# Patient Record
Sex: Male | Born: 1995 | Hispanic: Yes | Marital: Married | Smoking: Never smoker
Health system: Southern US, Community
[De-identification: ages and names within clinical notes are randomized; demographics above are authoritative.]

## PROBLEM LIST (undated history)

## (undated) DIAGNOSIS — R55 Syncope and collapse: Secondary | ICD-10-CM

## (undated) DIAGNOSIS — E162 Hypoglycemia, unspecified: Secondary | ICD-10-CM

## (undated) HISTORY — DX: Hypoglycemia, unspecified: E16.2

## (undated) HISTORY — DX: Syncope and collapse: R55

## (undated) SURGERY — ORCHIOPEXY ADULT
Anesthesia: General | Laterality: Bilateral

---
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
## 2013-04-02 ENCOUNTER — Ambulatory Visit (INDEPENDENT_AMBULATORY_CARE_PROVIDER_SITE_OTHER): Payer: BC Managed Care – PPO | Admitting: Physician Assistant

## 2013-04-02 VITALS — BP 120/80 | HR 76 | Temp 99.3°F | Resp 16 | Ht 71.5 in | Wt 165.0 lb

## 2013-04-02 DIAGNOSIS — J069 Acute upper respiratory infection, unspecified: Secondary | ICD-10-CM

## 2013-04-02 DIAGNOSIS — B9789 Other viral agents as the cause of diseases classified elsewhere: Principal | ICD-10-CM

## 2013-04-02 MED ORDER — GUAIFENESIN ER 1200 MG PO TB12: 1.0000 | ORAL_TABLET | Freq: Two times a day (BID) | ORAL | Status: DC | PRN

## 2013-04-02 MED ORDER — BENZONATATE 100 MG PO CAPS: 100.0000 mg | ORAL_CAPSULE | Freq: Three times a day (TID) | ORAL | Status: DC | PRN

## 2013-04-02 MED ORDER — IPRATROPIUM BROMIDE 0.03 % NA SOLN: 2.0000 | Freq: Two times a day (BID) | NASAL | Status: DC

## 2013-04-02 NOTE — Patient Instructions (Signed)
Get plenty of rest and drink at least 64 ounces of water daily. Use ibuprofen or acetaminophen as needed for body aches and fever. If your symptoms worsen, or do not begin to improve in the next 72 hours, please return for re-evaluation.

## 2013-04-02 NOTE — Progress Notes (Signed)
Subjective:    Patient ID: Robert HudsonKevin Levy, male    DOB: November 07, 1995, 18 y.o.   MRN: [id number]   PCP: No primary provider on file.  Chief Complaint  Patient presents with  . Back Pain    x 1 week  . Cough    x 1 week      Active Ambulatory Problems    Diagnosis Date Noted  . Abnormal EKG 05/30/2012  . HTN (hypertension) 05/30/2012   Resolved Ambulatory Problems    Diagnosis Date Noted  . Testicular torsion 02/24/2011   Past Medical History  Diagnosis Date  . Syncope   . Hypoglycemia     Past Surgical History  Procedure Laterality Date  . Testicle torsion reduction  02/24/2011    Procedure: TESTICULAR TORSION REPAIR;  Surgeon: Anner CreteJohn J Wrenn, MD;  [facility];  Service: Urology;  Laterality: N/A;    No Known Allergies  Prior to Admission medications   Not on File    History   Social History  . Marital Status: Single    Spouse Name: n/a    Number of Children: 0  . Years of Education: 12   Occupational History  . [employer]orter     [employer] (part time)   Social History Main Topics  . Smoking status: Never Smoker   . Smokeless tobacco: Never Used  . Alcohol Use: No  . Drug Use: No  . Sexual Activity: None   Other Topics Concern  . None   Social History Narrative   Graduated from [employer]. Plans to go to college fall 2015.    family history includes Hyperlipidemia in his father. indicated that his mother is alive. He indicated that his father is alive. He indicated that both of his sisters are alive. He indicated that his maternal grandmother is alive. He indicated that his maternal grandfather is alive. He indicated that his paternal grandmother is alive. He indicated that his paternal grandfather is alive.   HPI  "For the past few days, I've been having a cold." Thought it was just a "little cold." Now with body aches and "non-stop coughing." Feels feverish. Some low back pain with movement, like sit-to-stand. No nausea, vomiting.  Some  diarrhea.  No urinary symptoms. No radicular pain.  No lower extremity weakness or paresthesias.  He did get a flu vaccine this season.  Review of Systems No CP, palpitations, SOB, HA.    Objective:   Physical Exam Blood pressure 120/80, pulse 76, temperature 99.3 F (37.4 C), temperature source Oral, resp. rate 16, height 5' 11.5" (1.816 m), weight 165 lb (74.844 kg), SpO2 99.00%. Body mass index is 22.69 kg/(m^2). Well-developed, well nourished male who is awake, alert and oriented, in NAD. HEENT: [place]/AT, PERRL, EOMI.  Sclera and conjunctiva are clear.  EAC are patent, TMs are normal in appearance. Nasal mucosa is congested, pink and moist. OP is clear. Neck: supple, non-tender, no lymphadenopathy, thyromegaly. Heart: RRR, no murmur Lungs: normal effort, CTA Extremities: no cyanosis, clubbing or edema. Skin: warm and dry without rash. Psychologic: good mood and appropriate affect, normal speech and behavior.        Assessment & Plan:  1. Viral URI with cough Supportive care.  NSAIDS for muscle pain. RTC if symptoms worsen/persist. - ipratropium (ATROVENT) 0.03 % nasal spray; Place 2 sprays into both nostrils 2 (two) times daily.  Dispense: 30 mL; Refill: 0 - benzonatate (TESSALON) 100 MG capsule; Take 1-2 capsules (100-200 mg total) by mouth 3 (three)  times daily as needed for cough.  Dispense: 40 capsule; Refill: 0 - Guaifenesin (MUCINEX MAXIMUM STRENGTH) 1200 MG TB12; Take 1 tablet (1,200 mg total) by mouth every 12 (twelve) hours as needed.  Dispense: 14 tablet; Refill: 1   Fernande Bras, PA-C Physician Assistant-Certified [facility]

## 2014-03-04 ENCOUNTER — Other Ambulatory Visit: Payer: Self-pay

## 2014-03-04 ENCOUNTER — Encounter (HOSPITAL_COMMUNITY): Payer: Self-pay | Admitting: Family Medicine

## 2014-03-04 ENCOUNTER — Emergency Department (HOSPITAL_COMMUNITY)
Admission: EM | Admit: 2014-03-04 | Discharge: 2014-03-04 | Disposition: A | Discharge status: Home / Self Care | Payer: No Typology Code available for payment source | Attending: Emergency Medicine | Admitting: Emergency Medicine

## 2014-03-04 DIAGNOSIS — R110 Nausea: Secondary | ICD-10-CM | POA: Diagnosis not present

## 2014-03-04 DIAGNOSIS — Y9389 Activity, other specified: Secondary | ICD-10-CM | POA: Diagnosis not present

## 2014-03-04 DIAGNOSIS — Z8639 Personal history of other endocrine, nutritional and metabolic disease: Secondary | ICD-10-CM | POA: Insufficient documentation

## 2014-03-04 DIAGNOSIS — Y92410 Unspecified street and highway as the place of occurrence of the external cause: Secondary | ICD-10-CM | POA: Insufficient documentation

## 2014-03-04 DIAGNOSIS — Z79899 Other long term (current) drug therapy: Secondary | ICD-10-CM | POA: Insufficient documentation

## 2014-03-04 DIAGNOSIS — R42 Dizziness and giddiness: Secondary | ICD-10-CM | POA: Diagnosis not present

## 2014-03-04 DIAGNOSIS — R55 Syncope and collapse: Secondary | ICD-10-CM | POA: Diagnosis not present

## 2014-03-04 DIAGNOSIS — Y998 Other external cause status: Secondary | ICD-10-CM | POA: Diagnosis not present

## 2014-03-04 DIAGNOSIS — S199XXA Unspecified injury of neck, initial encounter: Secondary | ICD-10-CM | POA: Insufficient documentation

## 2014-03-04 MED ORDER — NAPROXEN 500 MG PO TABS: 500.0000 mg | ORAL_TABLET | Freq: Two times a day (BID) | ORAL | Status: DC
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
## 2020-02-29 ENCOUNTER — Emergency Department: Payer: No Typology Code available for payment source

## 2020-02-29 ENCOUNTER — Encounter: Payer: Self-pay | Admitting: Emergency Medicine

## 2020-02-29 ENCOUNTER — Other Ambulatory Visit: Payer: Self-pay

## 2020-02-29 ENCOUNTER — Emergency Department
Admission: EM | Admit: 2020-02-29 | Discharge: 2020-02-29 | Disposition: A | Discharge status: Home / Self Care | Payer: No Typology Code available for payment source | Attending: Emergency Medicine | Admitting: Emergency Medicine

## 2020-02-29 DIAGNOSIS — W1789XA Other fall from one level to another, initial encounter: Secondary | ICD-10-CM | POA: Insufficient documentation

## 2020-02-29 DIAGNOSIS — S20212A Contusion of left front wall of thorax, initial encounter: Secondary | ICD-10-CM

## 2020-02-29 DIAGNOSIS — W19XXXA Unspecified fall, initial encounter: Secondary | ICD-10-CM

## 2020-02-29 DIAGNOSIS — Y990 Civilian activity done for income or pay: Secondary | ICD-10-CM | POA: Diagnosis not present

## 2020-02-29 DIAGNOSIS — I10 Essential (primary) hypertension: Secondary | ICD-10-CM | POA: Insufficient documentation

## 2020-02-29 DIAGNOSIS — S299XXA Unspecified injury of thorax, initial encounter: Secondary | ICD-10-CM | POA: Diagnosis present

## 2020-02-29 MED ORDER — HYDROCODONE-ACETAMINOPHEN 5-325 MG PO TABS
1.0000 | ORAL_TABLET | Freq: Once | ORAL | Status: AC
Start: 2020-02-29 — End: 2020-02-29
  Administered 2020-02-29: 1 via ORAL
  Filled 2020-02-29: qty 1

## 2020-02-29 MED ORDER — MELOXICAM 15 MG PO TABS: 15.0000 mg | ORAL_TABLET | Freq: Every day | ORAL | 2 refills | Status: DC

## 2020-02-29 MED ORDER — BACLOFEN 10 MG PO TABS: 10.0000 mg | ORAL_TABLET | Freq: Three times a day (TID) | ORAL | 1 refills | Status: AC

## 2020-02-29 MED ORDER — HYDROCODONE-ACETAMINOPHEN 5-325 MG PO TABS: 1.0000 | ORAL_TABLET | Freq: Four times a day (QID) | ORAL | 0 refills | Status: DC | PRN

## 2020-02-29 NOTE — ED Triage Notes (Signed)
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  Pt states he fell on his left side.  PT c/o left rib pain and upper back discomfort. PT ambulatory to triage at this time and in NAD>

## 2020-02-29 NOTE — ED Notes (Signed)
Workers comp completed by Industrial/product designer. Pt's boss Jill Alexanders called to verify needed specimens, urine obtained and papers filled, placed in lab bin at 1244pm.

## 2020-02-29 NOTE — Discharge Instructions (Signed)
Return emergency department worsening.  Follow-up with a provider of Worker's Comp. choice if not improving in 3 to 5 days for repeat x-ray.  Take the medication as prescribed.  Do not drive vehicles while taking the narcotic

## 2020-02-29 NOTE — ED Provider Notes (Signed)
[facility] Emergency Department Provider Note  ____________________________________________   Event Date/Time   First MD Initiated Contact with Patient 02/29/20 1223     (approximate)  I have reviewed the triage vital signs and the nursing notes.   HISTORY  Chief Complaint Fall    HPI Robert Levy is a 25 y.o. 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  States he stepped onto the trailer and fell falling onto his left side.  States he has severe left rib pain.  He denies hitting his head or having abdominal pain.  No vomiting.  No LOC    Past Medical History:  Diagnosis Date  . Hypoglycemia   . Syncope     Patient Active Problem List   Diagnosis Date Noted  . Abnormal EKG 05/30/2012  . HTN (hypertension) 05/30/2012    Past Surgical History:  Procedure Laterality Date  . TESTICLE TORSION REDUCTION  02/24/2011   Procedure: TESTICULAR TORSION REPAIR;  Surgeon: Anner Crete, MD;  Location: [facility] OR;  Service: Urology;  Laterality: N/A;    Prior to Admission medications   Medication Sig Start Date End Date Taking? Authorizing Provider  baclofen (LIORESAL) 10 MG tablet Take 1 tablet (10 mg total) by mouth 3 (three) times daily for 10 days. 02/29/20 03/10/20 Yes Subrina Vecchiarelli, Roselyn Bering, PA-C  HYDROcodone-acetaminophen (NORCO/VICODIN) 5-325 MG tablet Take 1 tablet by mouth every 6 (six) hours as needed for moderate pain. 02/29/20  Yes Devynne Sturdivant, Roselyn Bering, PA-C  meloxicam (MOBIC) 15 MG tablet Take 1 tablet (15 mg total) by mouth daily. 02/29/20 02/28/21 Yes Tajah Noguchi, Roselyn Bering, PA-C  ipratropium (ATROVENT) 0.03 % nasal spray Place 2 sprays into both nostrils 2 (two) times daily. 04/02/13   Porfirio Oar, PA    Allergies Patient has no known allergies.  Family History  Problem Relation Age of Onset  . Hyperlipidemia Father     Social History Social History   Tobacco Use  . Smoking status: Never Smoker   . Smokeless tobacco: Never Used  Substance Use Topics  . Alcohol use: No  . Drug use: No    Review of Systems  Constitutional: No fever/chills Eyes: No visual changes. ENT: No sore throat. Respiratory: Denies cough, positive left rib pain Cardiovascular: Denies chest pain Gastrointestinal: Denies abdominal pain Genitourinary: Negative for dysuria. Musculoskeletal: Negative for back pain. Skin: Negative for rash. Psychiatric: no mood changes,     ____________________________________________   PHYSICAL EXAM:  VITAL SIGNS: ED Triage Vitals  Enc Vitals Group     BP 02/29/20 1132 137/85     Pulse Rate 02/29/20 1132 69     Resp 02/29/20 1132 18     Temp 02/29/20 1132 98.2 F (36.8 C)     Temp Source 02/29/20 1132 Oral     SpO2 02/29/20 1132 99 %     Weight 02/29/20 1131 185 lb (83.9 kg)     Height 02/29/20 1131 6' (1.829 m)     Head Circumference --      Peak Flow --      Pain Score 02/29/20 1130 6     Pain Loc --      Pain Edu? --      Excl. in GC? --     Constitutional: Alert and oriented. Well appearing and in no acute distress. Eyes: Conjunctivae are normal.  Head: Atraumatic. Nose: No congestion/rhinnorhea. Mouth/Throat: Mucous membranes are moist.  Neck:  supple  no lymphadenopathy noted Cardiovascular: Normal rate, regular rhythm. Heart sounds are normal Respiratory: Normal respiratory effort.  No retractions, lungs c t a, left ribs are very tender to palpation, no bruising is noted at the rib cage Abd: soft nontender bs normal all 4 quad GU: deferred Musculoskeletal: FROM all extremities, warm and well perfused Neurologic:  Normal speech and language.  Skin:  Skin is warm, dry and intact. No rash noted. Psychiatric: Mood and affect are normal. Speech and behavior are normal.  ____________________________________________   LABS (all labs ordered are listed, but only abnormal results are displayed)  Labs Reviewed - No data to  display ____________________________________________   ____________________________________________  RADIOLOGY  X-ray of the left ribs with chest are normal  ____________________________________________   PROCEDURES  Procedure(s) performed: No  Procedures    ____________________________________________   INITIAL IMPRESSION / ASSESSMENT AND PLAN / ED COURSE  Pertinent labs & imaging results that were available during my care of the patient were reviewed by me and considered in my medical decision making (see chart for details).   The patient is 25 year old male presents after a fall.  See HPI.  Physical exam shows left ribs to be tender.  Remainder exams unremarkable  X-ray of the left ribs and chest are normal.  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  Explained him would be okay to take the anti-inflammatory.  Worker's Comp. restrictions stating this were sent with him.  These restrictions are in place for 1 week.  If he is worsening he is to return the emergency department.  He was discharged in stable condition.  The tech covid completed the UDS for Worker's Comp.     Robert Levy was evaluated in Emergency Department on 02/29/2020 for the symptoms described in the history of present illness. He was evaluated in the context of the global COVID-19 pandemic, which necessitated consideration that the patient might be at risk for infection with the SARS-CoV-2 virus that causes COVID-19. Institutional protocols and algorithms that pertain to the evaluation of patients at risk for COVID-19 are in a state of rapid change based on information released by regulatory bodies including the CDC and federal and state organizations. These policies and algorithms were followed during the patient's care in the ED.    As part of my medical  decision making, I reviewed the following data within the electronic MEDICAL RECORD NUMBER Nursing notes reviewed and incorporated, Old chart reviewed, Radiograph reviewed , Notes from prior ED visits and [place] Controlled Substance Database  ____________________________________________   FINAL CLINICAL IMPRESSION(S) / ED DIAGNOSES  Final diagnoses:  Fall, initial encounter  Rib contusion, left, initial encounter      NEW MEDICATIONS STARTED DURING THIS VISIT:  Discharge Medication List as of 02/29/2020 12:35 PM    START taking these medications   Details  baclofen (LIORESAL) 10 MG tablet Take 1 tablet (10 mg total) by mouth 3 (three) times daily for 10 days., Starting Thu 02/29/2020, Until Sun 03/10/2020, Print    HYDROcodone-acetaminophen (NORCO/VICODIN) 5-325 MG tablet Take 1 tablet by mouth every 6 (six) hours as needed for moderate pain., Starting Thu 02/29/2020, Print    meloxicam (MOBIC) 15 MG tablet Take 1 tablet (15 mg total) by mouth daily., Starting Thu 02/29/2020, Until Fri 02/28/2021, Print         Note:  This document was prepared  using Conservation officer, historic buildings and may include unintentional dictation errors.    Faythe Ghee, PA-C 02/29/20 1423    Merwyn Katos, MD 02/29/20 [phone]

## 2020-10-03 ENCOUNTER — Emergency Department (HOSPITAL_COMMUNITY): Payer: No Typology Code available for payment source

## 2020-10-03 ENCOUNTER — Emergency Department (HOSPITAL_COMMUNITY)
Admission: EM | Admit: 2020-10-03 | Discharge: 2020-10-03 | Disposition: A | Discharge status: Home / Self Care | Payer: No Typology Code available for payment source | Attending: Emergency Medicine | Admitting: Emergency Medicine

## 2020-10-03 ENCOUNTER — Encounter (HOSPITAL_COMMUNITY): Payer: Self-pay | Admitting: *Deleted

## 2020-10-03 ENCOUNTER — Other Ambulatory Visit: Payer: Self-pay

## 2020-10-03 DIAGNOSIS — I10 Essential (primary) hypertension: Secondary | ICD-10-CM | POA: Diagnosis not present

## 2020-10-03 DIAGNOSIS — Y900 Blood alcohol level of less than 20 mg/100 ml: Secondary | ICD-10-CM | POA: Diagnosis not present

## 2020-10-03 DIAGNOSIS — S39012A Strain of muscle, fascia and tendon of lower back, initial encounter: Secondary | ICD-10-CM | POA: Insufficient documentation

## 2020-10-03 DIAGNOSIS — X500XXA Overexertion from strenuous movement or load, initial encounter: Secondary | ICD-10-CM | POA: Diagnosis not present

## 2020-10-03 DIAGNOSIS — S3992XA Unspecified injury of lower back, initial encounter: Secondary | ICD-10-CM | POA: Diagnosis present

## 2020-10-03 MED ORDER — NAPROXEN 500 MG PO TABS: 500.0000 mg | ORAL_TABLET | Freq: Two times a day (BID) | ORAL | 0 refills | Status: AC

## 2020-10-03 MED ORDER — OXYCODONE-ACETAMINOPHEN 5-325 MG PO TABS
1.0000 | ORAL_TABLET | Freq: Once | ORAL | Status: AC
  Administered 2020-10-03: 1 via ORAL
  Filled 2020-10-03: qty 1

## 2020-10-03 MED ORDER — KETOROLAC TROMETHAMINE 30 MG/ML IJ SOLN
30.0000 mg | Freq: Once | INTRAMUSCULAR | Status: AC
  Administered 2020-10-03: 30 mg via INTRAMUSCULAR
  Filled 2020-10-03: qty 1

## 2020-10-03 MED ORDER — METHOCARBAMOL 500 MG PO TABS
1000.0000 mg | ORAL_TABLET | Freq: Once | ORAL | Status: AC
Start: 2020-10-03 — End: 2020-10-03
  Administered 2020-10-03: 1000 mg via ORAL
  Filled 2020-10-03: qty 2

## 2020-10-03 MED ORDER — METHOCARBAMOL 500 MG PO TABS: 1000.0000 mg | ORAL_TABLET | Freq: Four times a day (QID) | ORAL | 0 refills | Status: DC | PRN

## 2020-10-03 NOTE — ED Notes (Signed)
An After Visit Summary was printed and given to the patient. Discharge instructions given and no further questions at this time.  Pt leaving with fiance.

## 2020-10-03 NOTE — Discharge Instructions (Addendum)
Rest in comfortable position with heating pad. Take Robaxin and Naproxen as needed for pain control. Follow-up with neurosurgery for further management if needed.

## 2020-10-03 NOTE — ED Provider Notes (Signed)
[facility]-EMERGENCY DEPT Provider Note   CSN: [id number] Arrival date & time: 10/03/20  1208     History Chief Complaint  Patient presents with   Back Pain    Robert Levy is a 25 y.o. male.  Patient with no significant past medical history presents today with chief complaint of low back injury. Patient states that he was lifting a heavy door at his job this morning and felt a stretching pain in his central lumbar spine followed by significant pain throughout his lower back and sacral region. Patient in obvious discomfort. Denies fever, chills, bowel or bladder incontinence, sacral numbness, or changes in motor function or sensation throughout lower extremities.   The history is provided by the patient. No language interpreter was used.  Back Pain Location:  Lumbar spine Quality:  Stabbing and cramping Radiates to:  R posterior upper leg Onset quality:  Sudden Associated symptoms: no abdominal pain, no abdominal swelling, no bladder incontinence, no bowel incontinence, no chest pain, no dysuria, no fever, no numbness, no paresthesias and no tingling       Past Medical History:  Diagnosis Date   Hypoglycemia    Syncope     Patient Active Problem List   Diagnosis Date Noted   Abnormal EKG 05/30/2012   HTN (hypertension) 05/30/2012    Past Surgical History:  Procedure Laterality Date   TESTICLE TORSION REDUCTION  02/24/2011   Procedure: TESTICULAR TORSION REPAIR;  Surgeon: Anner Crete, MD;  Location: [facility] OR;  Service: Urology;  Laterality: N/A;       Family History  Problem Relation Age of Onset   Hyperlipidemia Father     Social History   Tobacco Use   Smoking status: Never   Smokeless tobacco: Never  Substance Use Topics   Alcohol use: No   Drug use: No    Home Medications Prior to Admission medications   Medication Sig Start Date End Date Taking? Authorizing Provider  HYDROcodone-acetaminophen (NORCO/VICODIN) 5-325 MG tablet  Take 1 tablet by mouth every 6 (six) hours as needed for moderate pain. 02/29/20   Sherrie Mustache Roselyn Bering, PA-C  ipratropium (ATROVENT) 0.03 % nasal spray Place 2 sprays into both nostrils 2 (two) times daily. 04/02/13   Porfirio Oar, PA  meloxicam (MOBIC) 15 MG tablet Take 1 tablet (15 mg total) by mouth daily. 02/29/20 02/28/21  Faythe Ghee, PA-C    Allergies    Patient has no known allergies.  Review of Systems   Review of Systems  Constitutional:  Negative for chills and fever.  HENT:  Negative for ear pain and sore throat.   Eyes:  Negative for pain and visual disturbance.  Respiratory:  Negative for cough and shortness of breath.   Cardiovascular:  Negative for chest pain and palpitations.  Gastrointestinal:  Negative for abdominal pain, bowel incontinence and vomiting.  Genitourinary:  Negative for bladder incontinence, dysuria and hematuria.  Musculoskeletal:  Positive for back pain. Negative for arthralgias.  Skin:  Negative for color change and rash.  Neurological:  Negative for tingling, seizures, syncope, numbness and paresthesias.  All other systems reviewed and are negative.  Physical Exam Updated Vital Signs BP 125/81 (BP Location: Right Arm)   Pulse 80   Temp 98.3 F (36.8 C) (Oral)   Resp 18   SpO2 100%   Physical Exam Vitals and nursing note reviewed.  Constitutional:      General: He is not in acute distress.    Appearance: Normal appearance. He is  not ill-appearing, toxic-appearing or diaphoretic.  HENT:     Head: Normocephalic.  Cardiovascular:     Rate and Rhythm: Normal rate.  Pulmonary:     Effort: Pulmonary effort is normal.  Abdominal:     General: Abdomen is flat.  Musculoskeletal:     Cervical back: Normal and normal range of motion.     Thoracic back: Normal.     Lumbar back: Spasms and tenderness present. No swelling, edema, deformity, signs of trauma, lacerations or bony tenderness. Normal range of motion. Negative right straight leg raise  test and negative left straight leg raise test. No scoliosis.  Skin:    General: Skin is warm and dry.  Neurological:     General: No focal deficit present.     Mental Status: He is alert and oriented to person, place, and time.     Sensory: No sensory deficit.     Coordination: Coordination normal.  Psychiatric:        Behavior: Behavior normal.    ED Results / Procedures / Treatments   Labs (all labs ordered are listed, but only abnormal results are displayed) Labs Reviewed - No data to display  EKG None  Radiology DG Lumbar Spine Complete  Result Date: 10/03/2020 CLINICAL DATA:  Back injury EXAM: LUMBAR SPINE - COMPLETE 4+ VIEW COMPARISON:  None. FINDINGS: There is no evidence of lumbar spine fracture. Alignment is normal. Intervertebral disc spaces are maintained. IMPRESSION: Negative. Electronically Signed   By: Guadlupe Spanish M.D.   On: 10/03/2020 13:46    Procedures Procedures   Medications Ordered in ED Medications  ketorolac (TORADOL) 30 MG/ML injection 30 mg (has no administration in time range)  methocarbamol (ROBAXIN) tablet 1,000 mg (has no administration in time range)    ED Course  I have reviewed the triage vital signs and the nursing notes.  Pertinent labs & imaging results that were available during my care of the patient were reviewed by me and considered in my medical decision making (see chart for details).    MDM Rules/Calculators/A&P                         Patient presents today for evaluation of acute lower back injury. Patient is afebrile and non-toxic appearing. No step-offs or deformities noted on exam. Not suspicious for cauda equina, epidural abscess or other infectious process. No changes in sensation or motor function noted throughout lower extremities. DG Lumbar spine ordered at patients request, no deformity noted. Toradol, Percocet, and Robaxin given for immediate pain control. Suspect lumbar muscle strain, recommend rest, heat, and pain  control with Robaxin and Naproxen. Neurosurgery follow-up if needed.   Final Clinical Impression(s) / ED Diagnoses Final diagnoses:  Strain of lumbar region, initial encounter    Rx / DC Orders ED Discharge Orders          Ordered    methocarbamol (ROBAXIN) 500 MG tablet  Every 6 hours PRN        10/03/20 1415    naproxen (NAPROSYN) 500 MG tablet  2 times daily        10/03/20 1415          An After Visit Summary was printed and given to the patient.    Silva Bandy, [place] 10/03/20 1424    Jacalyn Lefevre, MD 10/03/20 1510

## 2020-10-03 NOTE — ED Triage Notes (Signed)
Pt complains of lower back pain since lifting heavy object at work today.

## 2023-03-20 IMAGING — CR DG LUMBAR SPINE COMPLETE 4+V
5 series · 5 of 5 positions shown · non-contrast
Comparison: None.

CLINICAL DATA: Back injury

EXAM:
LUMBAR SPINE - COMPLETE 4+ VIEW

[t lumbar spine ap]
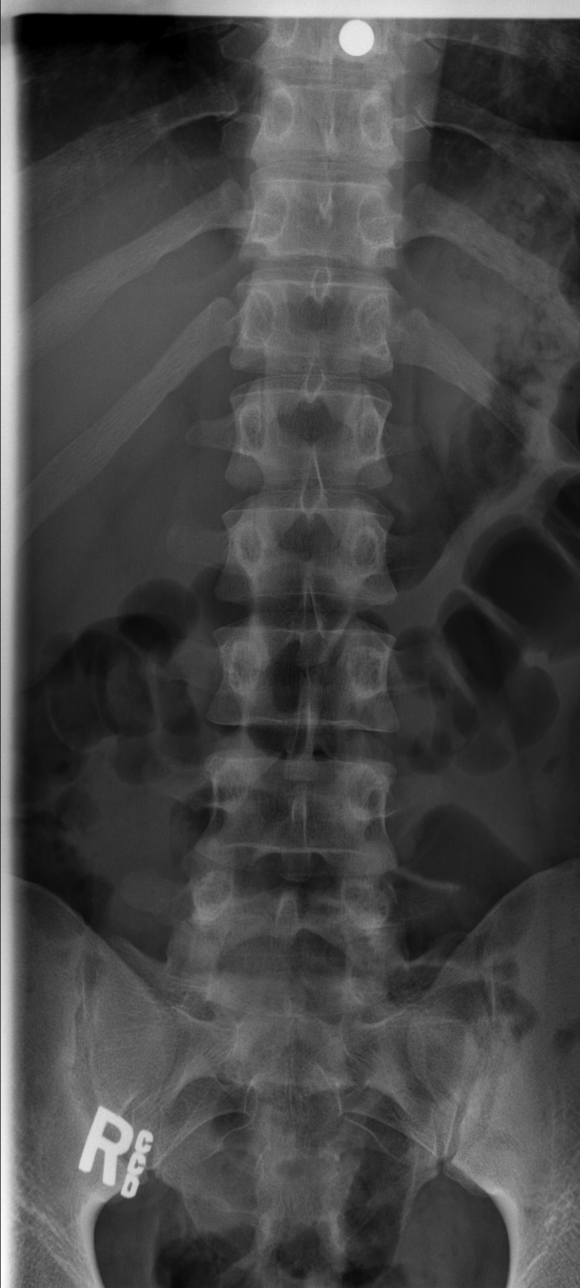

[t lumbar spine obl (1 of 2)]
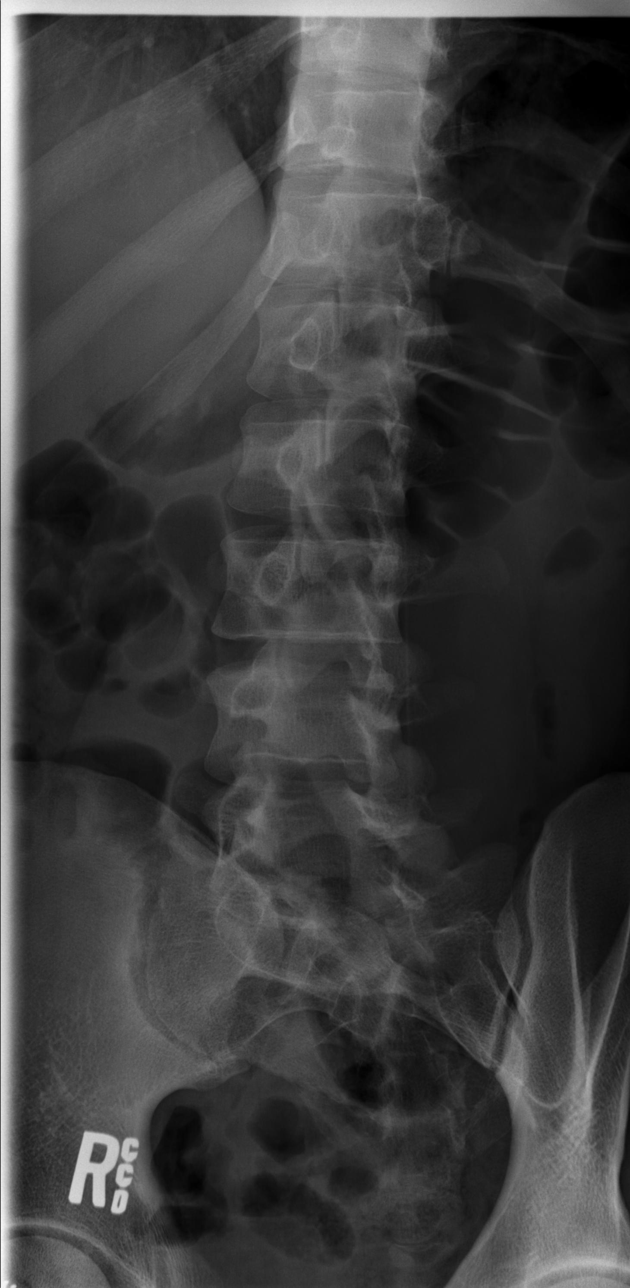

[t lumbar spine obl (2 of 2)]
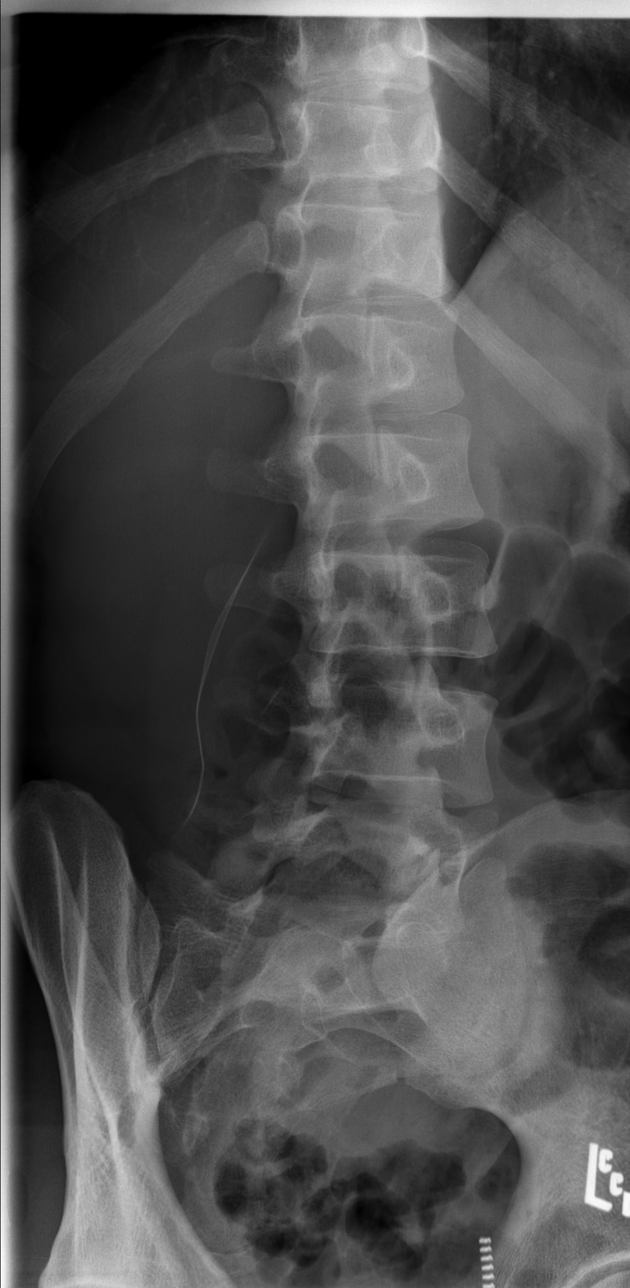

[t lumbar spine lat]
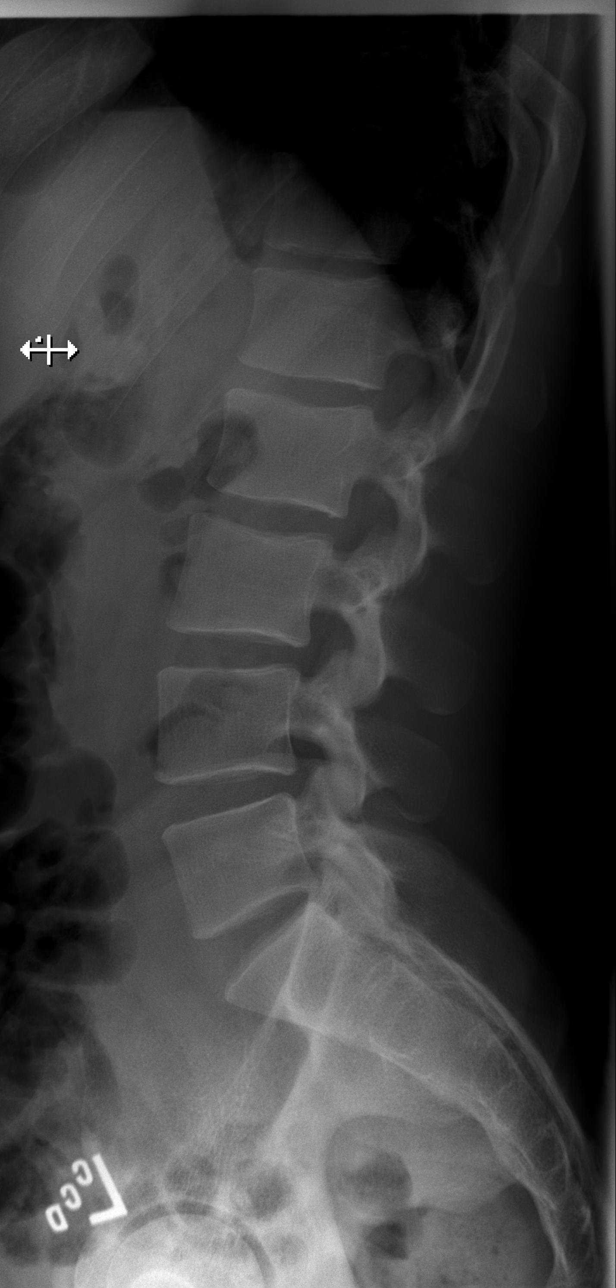

[t lumbar l-5 s-1 spot]
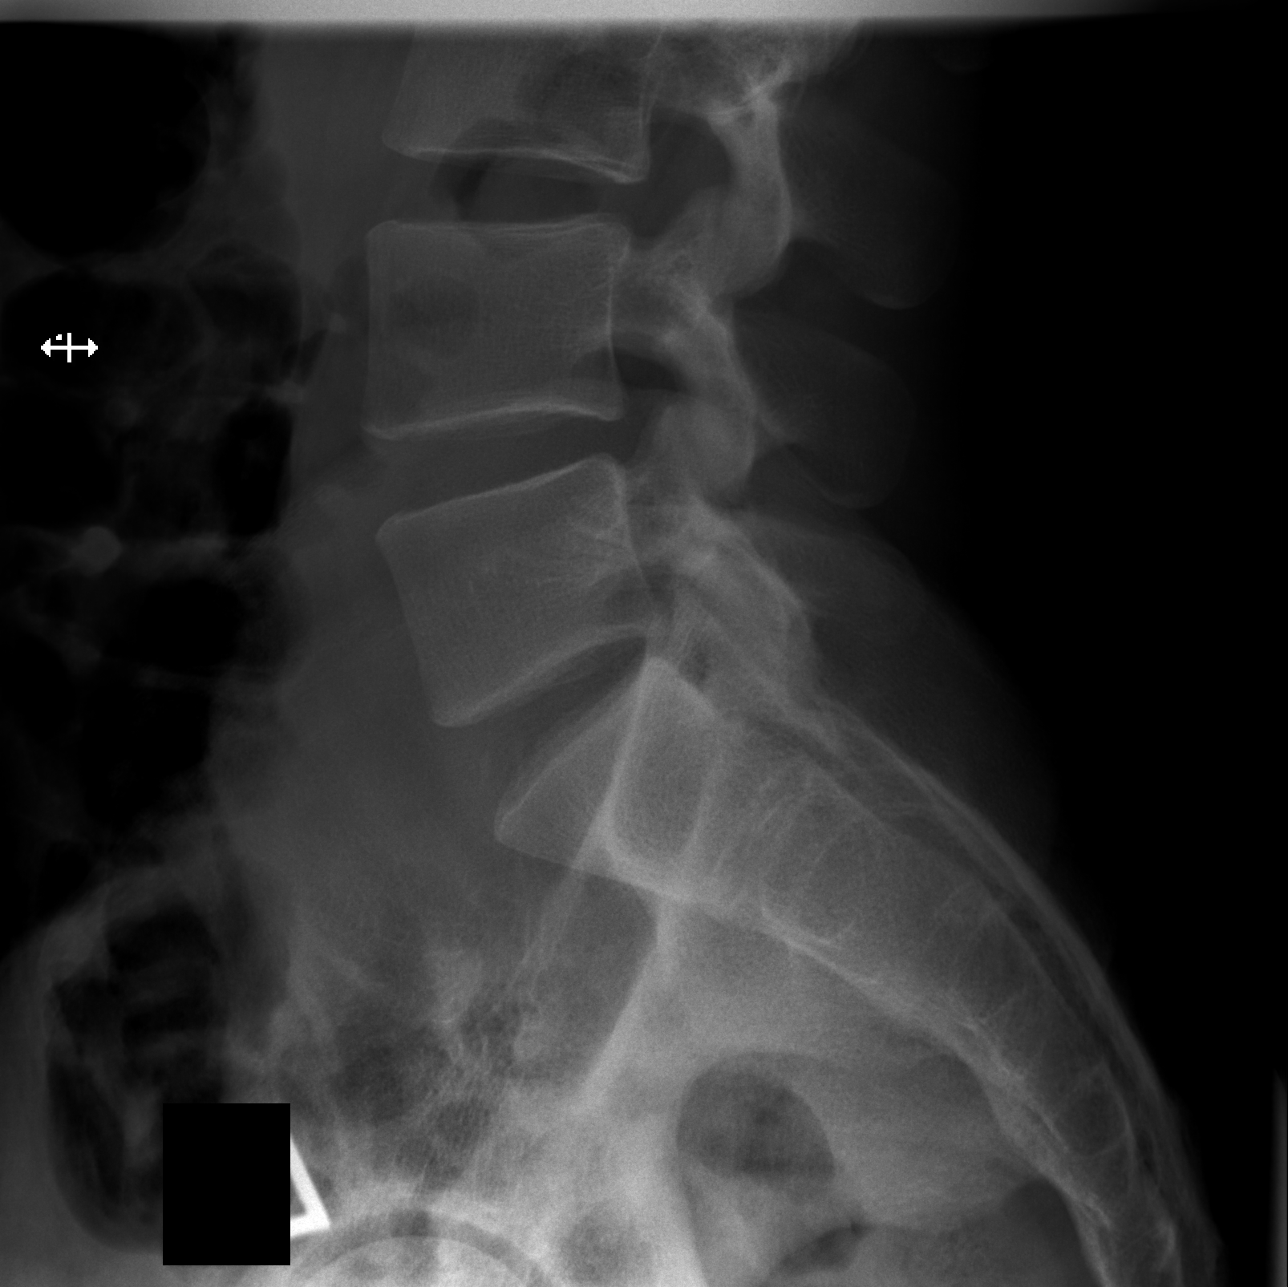

[5 of 5 positions shown; findings below may reference images not displayed]

FINDINGS: There is no evidence of lumbar spine fracture. Alignment is normal.
Intervertebral disc spaces are maintained.
IMPRESSION: Negative.

## 2023-12-31 ENCOUNTER — Emergency Department (HOSPITAL_COMMUNITY)
Admission: EM | Admit: 2023-12-31 | Discharge: 2023-12-31 | Disposition: A | Discharge status: Home / Self Care | Payer: Worker's Compensation | Attending: Emergency Medicine | Admitting: Emergency Medicine

## 2023-12-31 ENCOUNTER — Emergency Department (HOSPITAL_COMMUNITY): Payer: Worker's Compensation

## 2023-12-31 DIAGNOSIS — Z23 Encounter for immunization: Secondary | ICD-10-CM | POA: Diagnosis not present

## 2023-12-31 DIAGNOSIS — Y990 Civilian activity done for income or pay: Secondary | ICD-10-CM | POA: Diagnosis not present

## 2023-12-31 DIAGNOSIS — S8991XA Unspecified injury of right lower leg, initial encounter: Secondary | ICD-10-CM | POA: Diagnosis present

## 2023-12-31 DIAGNOSIS — S81011A Laceration without foreign body, right knee, initial encounter: Secondary | ICD-10-CM | POA: Diagnosis not present

## 2023-12-31 DIAGNOSIS — W1830XA Fall on same level, unspecified, initial encounter: Secondary | ICD-10-CM | POA: Diagnosis not present

## 2023-12-31 MED ORDER — HYDROMORPHONE HCL 1 MG/ML IJ SOLN
0.5000 mg | Freq: Once | INTRAMUSCULAR | Status: AC
  Administered 2023-12-31: 0.5 mg via INTRAVENOUS
  Filled 2023-12-31: qty 1

## 2023-12-31 MED ORDER — HYDROCODONE-ACETAMINOPHEN 5-325 MG PO TABS
1.0000 | ORAL_TABLET | Freq: Four times a day (QID) | ORAL | 0 refills | Status: AC | PRN
Start: 2023-12-31 — End: ?

## 2023-12-31 MED ORDER — LIDOCAINE-EPINEPHRINE-TETRACAINE (LET) TOPICAL GEL
3.0000 mL | Freq: Once | TOPICAL | Status: AC
  Administered 2023-12-31: 3 mL via TOPICAL
  Filled 2023-12-31: qty 3

## 2023-12-31 MED ORDER — TETANUS-DIPHTH-ACELL PERTUSSIS 5-2-15.5 LF-MCG/0.5 IM SUSP
0.5000 mL | Freq: Once | INTRAMUSCULAR | Status: AC
  Administered 2023-12-31: 0.5 mL via INTRAMUSCULAR
  Filled 2023-12-31: qty 0.5

## 2023-12-31 MED ORDER — SODIUM CHLORIDE 0.9 % IV SOLN
1.0000 g | Freq: Once | INTRAVENOUS | Status: AC
  Administered 2023-12-31: 1 g via INTRAVENOUS
  Filled 2023-12-31: qty 10

## 2023-12-31 MED ORDER — SODIUM CHLORIDE 0.9 % IV BOLUS
1000.0000 mL | Freq: Once | INTRAVENOUS | Status: AC
  Administered 2023-12-31: 1000 mL via INTRAVENOUS

## 2023-12-31 MED ORDER — LIDOCAINE HCL (PF) 1 % IJ SOLN
30.0000 mL | Freq: Once | INTRAMUSCULAR | Status: AC
  Administered 2023-12-31: 30 mL
  Filled 2023-12-31: qty 30

## 2023-12-31 MED ORDER — CEPHALEXIN 500 MG PO CAPS: 500.0000 mg | ORAL_CAPSULE | Freq: Two times a day (BID) | ORAL | 0 refills | Status: AC

## 2023-12-31 NOTE — Progress Notes (Signed)
 Orthopedic Tech Progress Note Patient Details:  Robert Levy [birth year] [id number]  Ortho Devices Type of Ortho Device: Crutches, Knee Immobilizer Ortho Device/Splint Location: RLE Ortho Device/Splint Interventions: Application   Post Interventions Patient Tolerated: Well  Massie BRAVO Khyri Hinzman 12/31/2023, 3:47 PM

## 2023-12-31 NOTE — ED Provider Notes (Signed)
 [place] EMERGENCY DEPARTMENT AT [facility] Provider Note   CSN: [id number] Arrival date & time: 12/31/23  1051     Patient presents with: Knee Pain   Robert Levy is a 28 y.o. male.   HPI Patient presents with his spouse after a fall that occurred at work. Patient was at work, walking down an incline metal walkway with studs for traction when he fell, striking his knee against the surface.  Patient sustained injury to the inferior right knee, no other trauma, no other complaints.    Prior to Admission medications   Medication Sig Start Date End Date Taking? Authorizing Provider  cephALEXin  (KEFLEX ) 500 MG capsule Take 1 capsule (500 mg total) by mouth 2 (two) times daily for 5 days. 12/31/23 01/05/24 Yes Garrick Charleston, MD  HYDROcodone -acetaminophen  (NORCO/VICODIN) 5-325 MG tablet Take 1 tablet by mouth every 6 (six) hours as needed for severe pain (pain score 7-10). 12/31/23  Yes Garrick Charleston, MD  methocarbamol  (ROBAXIN ) 500 MG tablet Take 2 tablets (1,000 mg total) by mouth every 6 (six) hours as needed for muscle spasms. 10/03/20   Smoot, Lauraine LABOR, PA-C    Allergies: Patient has no known allergies.    Review of Systems  Updated Vital Signs BP (!) 135/99 (BP Location: Left Arm)   Pulse 87   Temp 98 F (36.7 C) (Oral)   Resp (!) 24   SpO2 97%   Physical Exam Vitals and nursing note reviewed.  Constitutional:      General: He is not in acute distress.    Appearance: He is well-developed.  HENT:     Head: Normocephalic and atraumatic.  Eyes:     Conjunctiva/sclera: Conjunctivae normal.  Cardiovascular:     Rate and Rhythm: Normal rate and regular rhythm.     Pulses: Normal pulses.  Pulmonary:     Effort: Pulmonary effort is normal. No respiratory distress.     Breath sounds: No stridor.  Abdominal:     General: There is no distension.  Musculoskeletal:       Legs:  Skin:    General: Skin is warm and dry.  Neurological:     Mental  Status: He is alert and oriented to person, place, and time.     (all labs ordered are listed, but only abnormal results are displayed) Labs Reviewed - No data to display  EKG: None  Radiology: DG Knee Complete 4 Views Right Result Date: 12/31/2023 EXAM: 4 OR MORE VIEW(S) XRAY OF THE KNEE 12/31/2023 11:16:00 AM COMPARISON: None available. CLINICAL HISTORY: knee injury FINDINGS: BONES AND JOINTS: No acute fracture. No focal osseous lesion. No joint dislocation. No significant joint effusion. No significant degenerative changes. SOFT TISSUES: Soft tissue laceration anterior to the tibial tuberosity. Anterior soft tissue swelling. IMPRESSION: 1. Soft tissue laceration anterior to the tibial tuberosity with anterior soft tissue swelling. 2. No acute osseous abnormality. Electronically signed by: Waddell Calk MD 12/31/2023 12:12 PM EST RP Workstation: [id number]     .Laceration Repair  Date/Time: 12/31/2023 2:57 PM  Performed by: Garrick Charleston, MD Authorized by: Garrick Charleston, MD   Consent:    Consent obtained:  Verbal   Consent given by:  Patient   Risks, benefits, and alternatives were discussed: yes     Risks discussed:  Infection, pain, tendon damage, vascular damage, poor wound healing, poor cosmetic result, need for additional repair and nerve damage   Alternatives discussed:  Delayed treatment and referral Universal protocol:    Procedure explained and  questions answered to patient or proxy's satisfaction: yes     Relevant documents present and verified: yes     Test results available: yes     Imaging studies available: yes     Required blood products, implants, devices, and special equipment available: yes     Site/side marked: yes     Immediately prior to procedure, a time out was called: yes     Patient identity confirmed:  Verbally with patient Anesthesia:    Anesthesia method:  Local infiltration   Local anesthetic:  Lidocaine  1% w/o epi Laceration details:     Location:  Leg   Leg location:  R knee   Length (cm):  15   Depth (mm):  15 Pre-procedure details:    Preparation:  Patient was prepped and draped in usual sterile fashion and imaging obtained to evaluate for foreign bodies Exploration:    Limited defect created (wound extended): no     Hemostasis achieved with:  Direct pressure   Imaging obtained: x-ray     Imaging outcome: foreign body not noted     Wound exploration: entire depth of wound visualized     Wound exploration comment:  Patient not tolerant of range of motion   Wound extent: muscle damage, tendon damage (Possible) and vascular damage     Tendon damage location:  Lower extremity   Lower extremity tendon damage location:  Patella tendon   Tendon repair plan:  Refer for evaluation   Contaminated: yes   Treatment:    Area cleansed with:  Povidone-iodine   Amount of cleaning:  Extensive   Irrigation solution:  Sterile saline   Visualized foreign bodies/material removed: no     Debridement:  Moderate   Undermining:  None   Scar revision: no   Skin repair:    Repair method:  Sutures   Suture size:  4-0   Suture material:  Prolene   Suture technique:  Simple interrupted   Number of sutures:  7 Approximation:    Approximation:  Close Repair type:    Repair type:  Complex (Wound required debridement of portions of skin, closure with alignment as portions were also missing.) Post-procedure details:    Dressing:  Non-adherent dressing   Procedure completion:  Tolerated    Medications Ordered in the ED  sodium chloride  0.9 % bolus 1,000 mL (0 mLs Intravenous Stopped 12/31/23 1304)  HYDROmorphone  (DILAUDID ) injection 0.5 mg (0.5 mg Intravenous Given 12/31/23 1155)  cefTRIAXone  (ROCEPHIN ) 1 g in sodium chloride  0.9 % 100 mL IVPB (0 g Intravenous Stopped 12/31/23 1231)  Tdap (ADACEL ) injection 0.5 mL (0.5 mLs Intramuscular Given 12/31/23 1157)  lidocaine -EPINEPHrine -tetracaine  (LET) topical gel (3 mLs Topical Given  12/31/23 1156)  lidocaine  (PF) (XYLOCAINE ) 1 % injection 30 mL (30 mLs Other Given 12/31/23 1402)                                    Medical Decision Making Patient presents after mechanical fall has obvious injury to the right lower knee.  Patient with minimally, but appreciable extension, flexion capacity, may be secondary to pain, though concern for patella tendon contusion versus partial disruption is considered. After my initial assessment, cleaning the wound, I discussed patient's case with our colleague, Dr. Elsa from orthopedics.  Patient with closure here, after x-rays were reviewed, no fracture.  Tetanus, antibiotics, fluids, multiple analgesics all required.  Amount and/or Complexity of Data Reviewed  Independent Historian: spouse Radiology: ordered and independent interpretation performed. Decision-making details documented in ED Course.  Risk Prescription drug management. Decision regarding hospitalization.    3:04 PM Patient tolerated suture repair without complication, though he required antiemetics, multiple doses of local analgesia.  We discussed return precautions, follow-up instructions with Dr. Elsa in three days.  No current evidence for bacteremia, sepsis, patient can move the leg minimally, with extension, though with any pain or tendon disruption, patient will require close follow-up, immobilization, crutches.      Final diagnoses:  Laceration of right knee, initial encounter    ED Discharge Orders          Ordered    cephALEXin  (KEFLEX ) 500 MG capsule  2 times daily        12/31/23 1503    HYDROcodone -acetaminophen  (NORCO/VICODIN) 5-325 MG tablet  Every 6 hours PRN        12/31/23 1503               Garrick Charleston, MD 12/31/23 1504

## 2023-12-31 NOTE — Discharge Instructions (Addendum)
 Please be sure to call our orthopedic colleagues for follow-up on Monday.  Return here for concerning changes in your condition.  In addition, follow-up with your occupational health team at work.

## 2023-12-31 NOTE — ED Triage Notes (Signed)
 Fell on metal grate with spikes onto right knee. Severe pain throughout entire leg. Large lac per patient and friend - EMS wrapped the extremity. Patient can wiggle toes minimally.

## 2024-03-01 ENCOUNTER — Encounter (HOSPITAL_COMMUNITY): Payer: Self-pay

## 2024-03-01 ENCOUNTER — Emergency Department (HOSPITAL_COMMUNITY)
Admission: EM | Admit: 2024-03-01 | Discharge: 2024-03-01 | Disposition: A | Discharge status: Home / Self Care | Attending: Emergency Medicine | Admitting: Emergency Medicine

## 2024-03-01 ENCOUNTER — Other Ambulatory Visit: Payer: Self-pay

## 2024-03-01 ENCOUNTER — Emergency Department (HOSPITAL_COMMUNITY)

## 2024-03-01 DIAGNOSIS — R079 Chest pain, unspecified: Secondary | ICD-10-CM | POA: Insufficient documentation

## 2024-03-01 LAB — BASIC METABOLIC PANEL WITH GFR
Anion gap: 16 — ABNORMAL HIGH (ref 5–15)
BUN: 11 mg/dL (ref 6–20)
CO2: 21 mmol/L — ABNORMAL LOW (ref 22–32)
Calcium: 10.1 mg/dL (ref 8.9–10.3)
Chloride: 104 mmol/L (ref 98–111)
Creatinine, Ser: 0.84 mg/dL (ref 0.61–1.24)
GFR, Estimated: >60 mL/min
Glucose, Bld: 92 mg/dL (ref 70–99)
Potassium: 3.7 mmol/L (ref 3.5–5.1)
Sodium: 141 mmol/L (ref 135–145)

## 2024-03-01 LAB — CBC
HCT: 44.2 % (ref 39.0–52.0)
Hemoglobin: 16 g/dL (ref 13.0–17.0)
MCH: 30 pg (ref 26.0–34.0)
MCHC: 36.2 g/dL — ABNORMAL HIGH (ref 30.0–36.0)
MCV: 82.8 fL (ref 80.0–100.0)
Platelets: 243 K/uL (ref 150–400)
RBC: 5.34 MIL/uL (ref 4.22–5.81)
RDW: 12.7 % (ref 11.5–15.5)
WBC: 8.6 K/uL (ref 4.0–10.5)
nRBC: 0 % (ref 0.0–0.2)

## 2024-03-01 LAB — RESP PANEL BY RT-PCR (RSV, FLU A&B, COVID)  RVPGX2
Influenza A by PCR: NEGATIVE
Influenza B by PCR: NEGATIVE
Resp Syncytial Virus by PCR: NEGATIVE
SARS Coronavirus 2 by RT PCR: NEGATIVE

## 2024-03-01 LAB — TROPONIN T, HIGH SENSITIVITY
Troponin T High Sensitivity: 8 ng/L (ref 0–19)
Troponin T High Sensitivity: 7 ng/L (ref 0–19)

## 2024-03-01 MED ORDER — IOHEXOL 350 MG/ML SOLN
75.0000 mL | Freq: Once | INTRAVENOUS | Status: AC | PRN
  Administered 2024-03-01: 75 mL via INTRAVENOUS

## 2024-03-01 MED ORDER — FENTANYL CITRATE (PF) 50 MCG/ML IJ SOSY
25.0000 ug | PREFILLED_SYRINGE | Freq: Once | INTRAMUSCULAR | Status: AC
  Administered 2024-03-01: 25 ug via INTRAVENOUS
  Filled 2024-03-01: qty 1

## 2024-03-01 MED ORDER — ONDANSETRON HCL 4 MG/2ML IJ SOLN
4.0000 mg | Freq: Once | INTRAMUSCULAR | Status: AC
  Administered 2024-03-01: 4 mg via INTRAVENOUS
  Filled 2024-03-01: qty 2

## 2024-03-01 MED ORDER — SODIUM CHLORIDE 0.9 % IV BOLUS
500.0000 mL | Freq: Once | INTRAVENOUS | Status: AC
  Administered 2024-03-01: 500 mL via INTRAVENOUS

## 2024-03-01 MED ORDER — ETODOLAC 400 MG PO TABS: 400.0000 mg | ORAL_TABLET | Freq: Two times a day (BID) | ORAL | 0 refills | Status: AC

## 2024-03-01 NOTE — ED Notes (Signed)
 Patient transported to CT

## 2024-03-01 NOTE — ED Triage Notes (Signed)
 Pt BIB [facility] from [facility] d/t sharp central intermittent CP that started over the weekend. States that when he moves, lays down or breaths it hurts & it seems to have gotten worse the last few days. 12L good, NSR, LS clear, VSS & UC gave him 4 baby ASA & started 18g Lt AC PIV, A/Ox4.

## 2024-03-01 NOTE — Discharge Instructions (Signed)
 Your workup today was reassuring.  No concerning findings for the cause of your chest pain.  CT scan did not show any worrisome findings.  You likely have either costochondritis or pericarditis as we discussed.  Both of these conditions were treated with an anti-inflammatory medication.  I have sent Lodine  which is an anti-inflammatory medication into the pharmacy.  Do not combine this with other anti-inflammatory medication such as ibuprofen or Aleve .  However if this medication is expensive do not pick this up and take ibuprofen instead.  You can take 600 mg of ibuprofen 3 times a day for the next 7 days.  Please also call the clinic to have listed above to establish care.  Return for any concerning symptoms.

## 2024-03-01 NOTE — ED Provider Notes (Signed)
 " [place] [facility] AT [facility] Provider Note   CSN: [id number] Arrival date & time: 03/01/24  1015     Patient presents with: Chest Pain   Robert Levy is a 29 y.o. male.   29 year old male presents today for concern of chest pain that started yesterday but significantly worsened today with associated shortness of breath.  He states this woke him up from his sleep and he noticed he was gasping for air.  He tried to walk around to see if this would help but it did not.  He appears uncomfortable on interview.  Pain is reproducible with palpation and worse with taking a deep breath.  He was sick with flu around Christmas.  No other illness.  No other known health problems.  The history is provided by the patient and the spouse. No language interpreter was used.       Prior to Admission medications  Medication Sig Start Date End Date Taking? Authorizing Provider  etodolac  (LODINE ) 400 MG tablet Take 1 tablet (400 mg total) by mouth 2 (two) times daily. 03/01/24  Yes Ashunti Schofield, PA-C  HYDROcodone -acetaminophen  (NORCO/VICODIN) 5-325 MG tablet Take 1 tablet by mouth every 6 (six) hours as needed for severe pain (pain score 7-10). 12/31/23  Yes Garrick Charleston, MD    Allergies: Patient has no known allergies.    Review of Systems  Constitutional:  Negative for chills and fever.  Respiratory:  Positive for shortness of breath.   Cardiovascular:  Positive for chest pain. Negative for palpitations and leg swelling.  Gastrointestinal:  Negative for abdominal pain.  Neurological:  Negative for light-headedness.  All other systems reviewed and are negative.   Updated Vital Signs BP 129/79   Pulse (!) 57   Temp 97.8 F (36.6 C) (Oral)   Resp 10   Ht 6' (1.829 m)   Wt 90.7 kg   SpO2 100%   BMI 27.12 kg/m   Physical Exam Vitals and nursing note reviewed.  Constitutional:      General: He is not in acute distress.    Appearance: Normal  appearance. He is not ill-appearing.  HENT:     Head: Normocephalic and atraumatic.     Nose: Nose normal.  Eyes:     Conjunctiva/sclera: Conjunctivae normal.  Cardiovascular:     Rate and Rhythm: Normal rate and regular rhythm.     Heart sounds: Normal heart sounds.     No friction rub.     Comments: No visible JVD. Pulmonary:     Effort: Pulmonary effort is normal. No respiratory distress.     Breath sounds: Normal breath sounds. No wheezing or rales.     Comments: Lung sounds clear and symmetrical bilaterally. Musculoskeletal:        General: No deformity. Normal range of motion.     Right lower leg: No edema.     Left lower leg: No edema.  Skin:    Findings: No rash.  Neurological:     General: No focal deficit present.     Mental Status: He is alert and oriented to person, place, and time. Mental status is at baseline.     (all labs ordered are listed, but only abnormal results are displayed) Labs Reviewed  BASIC METABOLIC PANEL WITH GFR - Abnormal; Notable for the following components:      Result Value   CO2 21 (*)    Anion gap 16 (*)    All other components within normal limits  CBC - Abnormal; Notable for the following components:   MCHC 36.2 (*)    All other components within normal limits  RESP PANEL BY RT-PCR (RSV, FLU A&B, COVID)  RVPGX2  TROPONIN T, HIGH SENSITIVITY  TROPONIN T, HIGH SENSITIVITY    EKG: EKG Interpretation Date/Time:  Wednesday March 01 2024 10:19:39 EST Ventricular Rate:  75 PR Interval:  141 QRS Duration:  107 QT Interval:  392 QTC Calculation: 438 R Axis:   81  Text Interpretation: Sinus rhythm Probable LVH with secondary repol abnrm Borderline ST elevation, lateral leads downsloping st segments in inferior and lateral leads Otherwise no significant change Confirmed by Emil Share [phone]) on 03/01/2024 10:29:29 AM  Radiology: CT Angio Chest PE W and/or Wo Contrast Result Date: 03/01/2024 EXAM: CTA of the Chest with contrast for  PE 03/01/2024 02:00:43 PM TECHNIQUE: CTA of the chest was performed after the administration of 75 mL of iohexol  (OMNIPAQUE ) 350 MG/ML injection. Multiplanar reformatted images are provided for review. MIP images are provided for review. Automated exposure control, iterative reconstruction, and/or weight based adjustment of the mA/kV was utilized to reduce the radiation dose to as low as reasonably achievable. COMPARISON: 03/01/2024 CLINICAL HISTORY: Pulmonary embolism (PE) suspected, high prob. FINDINGS: PULMONARY ARTERIES: Pulmonary arteries are adequately opacified for evaluation. No pulmonary embolism. Main pulmonary artery is normal in caliber. MEDIASTINUM: The heart and pericardium demonstrate no acute abnormality. There is no acute abnormality of the thoracic aorta. Small amount of nonexpansile soft tissue in the anterior mediastinum, likely residual thymus. LYMPH NODES: No mediastinal, hilar or axillary lymphadenopathy. LUNGS AND PLEURA: The lungs are without acute process. No focal consolidation or pulmonary edema. No pleural effusion or pneumothorax. UPPER ABDOMEN: Limited images of the upper abdomen are unremarkable. SOFT TISSUES AND BONES: Small volume symmetric bilateral gynecomastia. No acute bone abnormality. IMPRESSION: 1. No pulmonary embolism. No pneumonia, pulmonary edema, or pleural effusion. Electronically signed by: Rogelia Myers MD 03/01/2024 02:29 PM EST RP Workstation: [id number]   DG Chest Portable 1 View Result Date: 03/01/2024 CLINICAL DATA:  Chest pain and dyspnea EXAM: PORTABLE CHEST 1 VIEW COMPARISON:  February 29, 2020 FINDINGS: The heart size and mediastinal contours are within normal limits. Both lungs are clear. The visualized skeletal structures are unremarkable. IMPRESSION: No active disease. Electronically Signed   By: Lynwood Landy Raddle M.D.   On: 03/01/2024 11:14     Procedures   Medications Ordered in the ED  ondansetron  (ZOFRAN ) injection 4 mg (4 mg Intravenous Given  03/01/24 1037)  fentaNYL  (SUBLIMAZE ) injection 25 mcg (25 mcg Intravenous Given 03/01/24 1038)  iohexol  (OMNIPAQUE ) 350 MG/ML injection 75 mL (75 mLs Intravenous Contrast Given 03/01/24 1402)    Clinical Course as of 03/01/24 1446  Wed Mar 01, 2024  1100 Bedside cardiac ultrasound performed by me and my attending does not show pericardial tamponade.  Will await chest x-ray and the remainder of the workup. [AA]  1109 Chest x-ray on my evaluation does not show acute process.  No widened mediastinum or enlarged cardiac silhouette or obvious pneumothorax. [AA]  1408 Is feeling improved on reevaluation.  Still having chest discomfort he does appear comfortable in contrast to when I initially saw him.  CT chest PE study pending. [AA]    Clinical Course User Index [AA] Hildegard Loge, PA-C                                 Medical Decision Making  Amount and/or Complexity of Data Reviewed Labs: ordered. Radiology: ordered.  Risk Prescription drug management.   Medical Decision Making / ED Course   This patient presents to the ED for concern of chest pain, shortness of breath, this involves an extensive number of treatment options, and is a complaint that carries with it a high risk of complications and morbidity.  The differential diagnosis includes ACS, PE, dissection, pneumonia, MSK etiology, pericarditis, myocarditis  MDM: 29 year old male presents today with chest pain that suddenly worsened this morning with associated shortness of breath.  He appears uncomfortable during our interview. Bedside ultrasound of the heart does not show any evidence of tamponade.  No signs of volume overload on exam. CBC without leukocytosis or anemia.  BMP without acute concern with the exception of mild anion gap.  Half liter fluid bolus given for this.  On reevaluation he was significantly improved.  Chest x-ray without acute finding.  CT angio chest PE study without acute concern.  Respiratory panel  negative.  MSK etiology.  Given recent viral infection could be costochondritis or pericarditis.  Will treat with 1 week course of anti-inflammatories.  Patient given a PCP referral.  Patient discharged in stable condition.   Additional history obtained: -Additional history obtained from wife at bedside -External records from outside source obtained and reviewed including: Chart review including previous notes, labs, imaging, consultation notes   Lab Tests: -I ordered, reviewed, and interpreted labs.   The pertinent results include:   Labs Reviewed  BASIC METABOLIC PANEL WITH GFR - Abnormal; Notable for the following components:      Result Value   CO2 21 (*)    Anion gap 16 (*)    All other components within normal limits  CBC - Abnormal; Notable for the following components:   MCHC 36.2 (*)    All other components within normal limits  RESP PANEL BY RT-PCR (RSV, FLU A&B, COVID)  RVPGX2  TROPONIN T, HIGH SENSITIVITY  TROPONIN T, HIGH SENSITIVITY      EKG  EKG Interpretation Date/Time:  Wednesday March 01 2024 10:19:39 EST Ventricular Rate:  75 PR Interval:  141 QRS Duration:  107 QT Interval:  392 QTC Calculation: 438 R Axis:   81  Text Interpretation: Sinus rhythm Probable LVH with secondary repol abnrm Borderline ST elevation, lateral leads downsloping st segments in inferior and lateral leads Otherwise no significant change Confirmed by Emil Share [phone]) on 03/01/2024 10:29:29 AM         Imaging Studies ordered: I ordered imaging studies including chest x-ray, CT angio chest PE study I independently visualized and interpreted imaging. I agree with the radiologist interpretation   Medicines ordered and prescription drug management: Meds ordered this encounter  Medications   ondansetron  (ZOFRAN ) injection 4 mg   fentaNYL  (SUBLIMAZE ) injection 25 mcg   iohexol  (OMNIPAQUE ) 350 MG/ML injection 75 mL   etodolac  (LODINE ) 400 MG tablet    Sig: Take 1 tablet  (400 mg total) by mouth 2 (two) times daily.    Dispense:  14 tablet    Refill:  0    Supervising Provider:   CLEOTILDE ROGUE [[id number]]    -I have reviewed the patients home medicines and have made adjustments as needed  Reevaluation: After the interventions noted above, I reevaluated the patient and found that they have :improved  Co morbidities that complicate the patient evaluation  Past Medical History:  Diagnosis Date   Hypoglycemia    Syncope  Dispostion: Discharged in stable condition.  Return precaution discussed.  Patient voices understanding and is in agreement with plan.  Final diagnoses:  Nonspecific chest pain    ED Discharge Orders          Ordered    etodolac  (LODINE ) 400 MG tablet  2 times daily        03/01/24 1441               Hildegard Loge, PA-C 03/01/24 1453    Emil Share, DO 03/01/24 1500  "
